# Patient Record
Sex: Female | Born: 1935
Health system: Southern US, Community
[De-identification: ages and names within clinical notes are randomized; demographics above are authoritative.]

## PROBLEM LIST (undated history)

## (undated) DIAGNOSIS — E119 Type 2 diabetes mellitus without complications: Secondary | ICD-10-CM

## (undated) DIAGNOSIS — I10 Essential (primary) hypertension: Secondary | ICD-10-CM

## (undated) HISTORY — PX: APPENDECTOMY: SHX54

---

## 2000-03-30 ENCOUNTER — Encounter: Admission: RE | Admit: 2000-03-30 | Discharge: 2000-03-30 | Payer: Self-pay | Admitting: Family Medicine

## 2000-03-30 ENCOUNTER — Encounter: Payer: Self-pay | Admitting: Family Medicine

## 2001-04-19 ENCOUNTER — Encounter: Payer: Self-pay | Admitting: Family Medicine

## 2001-04-19 ENCOUNTER — Encounter: Admission: RE | Admit: 2001-04-19 | Discharge: 2001-04-19 | Payer: Self-pay | Admitting: Family Medicine

## 2002-05-11 ENCOUNTER — Encounter: Payer: Self-pay | Admitting: Family Medicine

## 2002-05-11 ENCOUNTER — Encounter: Admission: RE | Admit: 2002-05-11 | Discharge: 2002-05-11 | Payer: Self-pay | Admitting: Family Medicine

## 2002-10-31 ENCOUNTER — Other Ambulatory Visit: Admission: RE | Admit: 2002-10-31 | Discharge: 2002-10-31 | Payer: Self-pay | Admitting: Family Medicine

## 2002-11-15 ENCOUNTER — Encounter: Admission: RE | Admit: 2002-11-15 | Discharge: 2002-11-15 | Payer: Self-pay | Admitting: Family Medicine

## 2002-11-15 ENCOUNTER — Encounter: Payer: Self-pay | Admitting: Family Medicine

## 2003-02-09 ENCOUNTER — Encounter (INDEPENDENT_AMBULATORY_CARE_PROVIDER_SITE_OTHER): Payer: Self-pay | Admitting: *Deleted

## 2003-02-09 ENCOUNTER — Ambulatory Visit (HOSPITAL_COMMUNITY): Admission: RE | Admit: 2003-02-09 | Discharge: 2003-02-09 | Payer: Self-pay

## 2003-06-19 ENCOUNTER — Encounter: Payer: Self-pay | Admitting: Family Medicine

## 2003-06-19 ENCOUNTER — Encounter: Admission: RE | Admit: 2003-06-19 | Discharge: 2003-06-19 | Payer: Self-pay | Admitting: Family Medicine

## 2003-08-14 ENCOUNTER — Encounter: Payer: Self-pay | Admitting: Family Medicine

## 2003-08-14 ENCOUNTER — Encounter: Admission: RE | Admit: 2003-08-14 | Discharge: 2003-08-14 | Payer: Self-pay | Admitting: Family Medicine

## 2003-11-20 ENCOUNTER — Other Ambulatory Visit: Admission: RE | Admit: 2003-11-20 | Discharge: 2003-11-20 | Payer: Self-pay | Admitting: Obstetrics and Gynecology

## 2004-07-01 ENCOUNTER — Encounter: Admission: RE | Admit: 2004-07-01 | Discharge: 2004-07-01 | Payer: Self-pay | Admitting: Family Medicine

## 2004-11-12 ENCOUNTER — Other Ambulatory Visit: Admission: RE | Admit: 2004-11-12 | Discharge: 2004-11-12 | Payer: Self-pay | Admitting: Family Medicine

## 2005-03-05 ENCOUNTER — Ambulatory Visit (HOSPITAL_COMMUNITY): Admission: RE | Admit: 2005-03-05 | Discharge: 2005-03-05 | Payer: Self-pay | Admitting: Gastroenterology

## 2005-07-09 ENCOUNTER — Encounter: Admission: RE | Admit: 2005-07-09 | Discharge: 2005-07-09 | Payer: Self-pay | Admitting: Family Medicine

## 2006-08-02 ENCOUNTER — Encounter: Admission: RE | Admit: 2006-08-02 | Discharge: 2006-08-02 | Payer: Self-pay | Admitting: Family Medicine

## 2007-08-01 ENCOUNTER — Other Ambulatory Visit: Admission: RE | Admit: 2007-08-01 | Discharge: 2007-08-01 | Payer: Self-pay | Admitting: Family Medicine

## 2007-08-04 ENCOUNTER — Encounter: Admission: RE | Admit: 2007-08-04 | Discharge: 2007-08-04 | Payer: Self-pay | Admitting: Family Medicine

## 2008-08-07 ENCOUNTER — Encounter: Admission: RE | Admit: 2008-08-07 | Discharge: 2008-08-07 | Payer: Self-pay | Admitting: Family Medicine

## 2009-08-08 ENCOUNTER — Encounter: Admission: RE | Admit: 2009-08-08 | Discharge: 2009-08-08 | Payer: Self-pay | Admitting: Family Medicine

## 2010-01-19 ENCOUNTER — Emergency Department (HOSPITAL_COMMUNITY): Admission: EM | Admit: 2010-01-19 | Discharge: 2010-01-19 | Payer: Self-pay | Admitting: Family Medicine

## 2010-09-02 ENCOUNTER — Encounter: Admission: RE | Admit: 2010-09-02 | Discharge: 2010-09-02 | Payer: Self-pay | Admitting: Family Medicine

## 2011-05-08 NOTE — Op Note (Signed)
   NAME:  Meghan Adkins, Meghan Adkins                            ACCOUNT NO.:  [id number]   MEDICAL RECORD NO.:  [id number]                   PATIENT TYPE:  AMB   LOCATION:  SDC                                  FACILITY:  [facility]   PHYSICIAN:  Ronda Fairly. Galen Daft, M.D.              DATE OF BIRTH:  1932/11/09   DATE OF PROCEDURE:  02/19/2003  DATE OF DISCHARGE:  02/09/2003                                 OPERATIVE REPORT   PREOPERATIVE DIAGNOSIS:  Uterine polyp.   POSTOPERATIVE DIAGNOSIS:  Uterine polyp.   PROCEDURE:  Hysteroscopic polypectomy with dilatation and curettage.   COMPLICATIONS:  None.   SURGEON:  Ronda Fairly. Galen Daft, M.D.   ESTIMATED BLOOD LOSS:  Minimal.   DESCRIPTION OF PROCEDURE:  The patient was identified positively.  We  obtained informed consent prior to bringing her to the operating room.  She  underwent hysteroscopy with a large amount of polyp-like material removed  from the uterus.  There were no complications from the procedure.  Curettage  was carried out at the end to complete the procedure.  She left the  operating room in stable condition.  Prior to her leaving the operating  room, all instrument, sponge, and needle counts were correct.  There were no  complications.                                               Ronda Fairly. Galen Daft, M.D.    NJT/MEDQ  D:  02/19/2003  T:  02/19/2003  Job:  [id number]

## 2011-05-08 NOTE — Op Note (Signed)
Meghan Adkins, Meghan Adkins                  ACCOUNT NO.:  [id number]   MEDICAL RECORD NO.:  [id number]          PATIENT TYPE:  AMB   LOCATION:  ENDO                         FACILITY:  [facility]   PHYSICIAN:  James L. Malon Kindle., M.D.DATE OF BIRTH:  Nov 18, 1932   DATE OF PROCEDURE:  03/05/2005  DATE OF DISCHARGE:                                 OPERATIVE REPORT   PROCEDURE PERFORMED:  Colonoscopy.   ENDOSCOPIST:  Llana Aliment. Edwards, M.D.   MEDICATIONS:  Fentanyl 75 mcg, Versed 6 mg IV.   INSTRUMENT USED:  Pediatric Olympus adjustable colonoscope.   INDICATIONS FOR PROCEDURE:  Colon cancer screening.   DESCRIPTION OF PROCEDURE:  The procedure had been explained to the patient  in the office and consent obtained.  With the patient in the left lateral  decubitus position, the pediatric adjustable colonoscope was inserted and  advanced.  The prep was quite good and we were able to advance to the cecum  without difficulty.  The ileocecal valve and appendiceal orifice were seen.  The scope was withdrawn.  There was pandiverticulosis throughout the entire  colon.  No evidence of diverticulitis, no polyps were seen in ascending or  descending __________ the sigmoid colon were all normal.  Prep was good.  The rectum was free of polyps.  The scope was withdrawn.  The patient  tolerated the procedure well.   ASSESSMENT:  1.  Normal screening colonoscopy with no evidence of polyps, V7651.  2.  Pandiverticulosis, 16109   PLAN:  Will give a diverticulosis sheet and recommend yearly Hemoccults.      JLE/MEDQ  D:  03/05/2005  T:  03/05/2005  Job:  [id number]   cc:   Gretta Arab. Valentina Lucks, M.D.  [address] [place]  [place] [address]  Fax: [phone]

## 2011-08-20 ENCOUNTER — Other Ambulatory Visit: Payer: Self-pay | Admitting: Family Medicine

## 2011-08-20 DIAGNOSIS — Z1231 Encounter for screening mammogram for malignant neoplasm of breast: Secondary | ICD-10-CM

## 2011-09-04 ENCOUNTER — Ambulatory Visit: Payer: Self-pay

## 2011-09-25 ENCOUNTER — Ambulatory Visit
Admission: RE | Admit: 2011-09-25 | Discharge: 2011-09-25 | Disposition: A | Discharge status: Home / Self Care | Payer: MEDICARE | Source: Ambulatory Visit | Attending: Family Medicine | Admitting: Family Medicine

## 2011-09-25 DIAGNOSIS — Z1231 Encounter for screening mammogram for malignant neoplasm of breast: Secondary | ICD-10-CM

## 2012-09-22 ENCOUNTER — Other Ambulatory Visit: Payer: Self-pay | Admitting: Family Medicine

## 2012-09-22 DIAGNOSIS — Z1231 Encounter for screening mammogram for malignant neoplasm of breast: Secondary | ICD-10-CM

## 2012-10-20 ENCOUNTER — Ambulatory Visit
Admission: RE | Admit: 2012-10-20 | Discharge: 2012-10-20 | Disposition: A | Discharge status: Home / Self Care | Payer: MEDICARE | Source: Ambulatory Visit | Attending: Family Medicine | Admitting: Family Medicine

## 2012-10-20 DIAGNOSIS — Z1231 Encounter for screening mammogram for malignant neoplasm of breast: Secondary | ICD-10-CM

## 2013-10-03 ENCOUNTER — Other Ambulatory Visit: Payer: Self-pay | Admitting: Family Medicine

## 2013-10-03 DIAGNOSIS — Z1231 Encounter for screening mammogram for malignant neoplasm of breast: Secondary | ICD-10-CM

## 2013-10-25 ENCOUNTER — Ambulatory Visit
Admission: RE | Admit: 2013-10-25 | Discharge: 2013-10-25 | Disposition: A | Discharge status: Home / Self Care | Payer: Medicare Other | Source: Ambulatory Visit | Attending: Family Medicine | Admitting: Family Medicine

## 2013-10-25 DIAGNOSIS — Z1231 Encounter for screening mammogram for malignant neoplasm of breast: Secondary | ICD-10-CM

## 2014-09-25 ENCOUNTER — Other Ambulatory Visit: Payer: Self-pay

## 2014-09-25 DIAGNOSIS — Z1239 Encounter for other screening for malignant neoplasm of breast: Secondary | ICD-10-CM

## 2014-10-30 ENCOUNTER — Ambulatory Visit
Admission: RE | Admit: 2014-10-30 | Discharge: 2014-10-30 | Disposition: A | Discharge status: Home / Self Care | Payer: Medicare Other | Source: Ambulatory Visit

## 2014-10-30 ENCOUNTER — Encounter (INDEPENDENT_AMBULATORY_CARE_PROVIDER_SITE_OTHER): Payer: Self-pay

## 2014-10-30 DIAGNOSIS — Z1239 Encounter for other screening for malignant neoplasm of breast: Secondary | ICD-10-CM

## 2015-09-25 ENCOUNTER — Other Ambulatory Visit: Payer: Self-pay

## 2015-09-25 DIAGNOSIS — Z1231 Encounter for screening mammogram for malignant neoplasm of breast: Secondary | ICD-10-CM

## 2015-11-04 ENCOUNTER — Ambulatory Visit
Admission: RE | Admit: 2015-11-04 | Discharge: 2015-11-04 | Disposition: A | Discharge status: Home / Self Care | Payer: Medicare Other | Source: Ambulatory Visit

## 2015-11-04 DIAGNOSIS — Z1231 Encounter for screening mammogram for malignant neoplasm of breast: Secondary | ICD-10-CM

## 2016-03-18 DIAGNOSIS — H43811 Vitreous degeneration, right eye: Secondary | ICD-10-CM | POA: Diagnosis not present

## 2016-03-18 DIAGNOSIS — H2513 Age-related nuclear cataract, bilateral: Secondary | ICD-10-CM | POA: Diagnosis not present

## 2016-03-18 DIAGNOSIS — E119 Type 2 diabetes mellitus without complications: Secondary | ICD-10-CM | POA: Diagnosis not present

## 2016-03-18 DIAGNOSIS — H401132 Primary open-angle glaucoma, bilateral, moderate stage: Secondary | ICD-10-CM | POA: Diagnosis not present

## 2016-06-16 DIAGNOSIS — E119 Type 2 diabetes mellitus without complications: Secondary | ICD-10-CM | POA: Diagnosis not present

## 2016-06-16 DIAGNOSIS — I10 Essential (primary) hypertension: Secondary | ICD-10-CM | POA: Diagnosis not present

## 2016-06-16 DIAGNOSIS — M7502 Adhesive capsulitis of left shoulder: Secondary | ICD-10-CM | POA: Diagnosis not present

## 2016-06-16 DIAGNOSIS — E7800 Pure hypercholesterolemia, unspecified: Secondary | ICD-10-CM | POA: Diagnosis not present

## 2016-06-22 DIAGNOSIS — M25512 Pain in left shoulder: Secondary | ICD-10-CM | POA: Diagnosis not present

## 2016-06-22 DIAGNOSIS — M7502 Adhesive capsulitis of left shoulder: Secondary | ICD-10-CM | POA: Diagnosis not present

## 2016-07-02 DIAGNOSIS — M25512 Pain in left shoulder: Secondary | ICD-10-CM | POA: Diagnosis not present

## 2016-07-02 DIAGNOSIS — M7502 Adhesive capsulitis of left shoulder: Secondary | ICD-10-CM | POA: Diagnosis not present

## 2016-09-30 ENCOUNTER — Other Ambulatory Visit: Payer: Self-pay | Admitting: Family Medicine

## 2016-09-30 DIAGNOSIS — Z1231 Encounter for screening mammogram for malignant neoplasm of breast: Secondary | ICD-10-CM

## 2016-11-09 ENCOUNTER — Ambulatory Visit
Admission: RE | Admit: 2016-11-09 | Discharge: 2016-11-09 | Disposition: A | Discharge status: Home / Self Care | Payer: Medicare Other | Source: Ambulatory Visit | Attending: Family Medicine | Admitting: Family Medicine

## 2016-11-09 DIAGNOSIS — H401132 Primary open-angle glaucoma, bilateral, moderate stage: Secondary | ICD-10-CM | POA: Diagnosis not present

## 2016-11-09 DIAGNOSIS — Z1231 Encounter for screening mammogram for malignant neoplasm of breast: Secondary | ICD-10-CM

## 2016-11-09 DIAGNOSIS — H2513 Age-related nuclear cataract, bilateral: Secondary | ICD-10-CM | POA: Diagnosis not present

## 2016-11-09 DIAGNOSIS — H43811 Vitreous degeneration, right eye: Secondary | ICD-10-CM | POA: Diagnosis not present

## 2016-11-09 DIAGNOSIS — E119 Type 2 diabetes mellitus without complications: Secondary | ICD-10-CM | POA: Diagnosis not present

## 2016-11-24 DIAGNOSIS — Z23 Encounter for immunization: Secondary | ICD-10-CM | POA: Diagnosis not present

## 2016-11-24 DIAGNOSIS — E119 Type 2 diabetes mellitus without complications: Secondary | ICD-10-CM | POA: Diagnosis not present

## 2016-11-24 DIAGNOSIS — I10 Essential (primary) hypertension: Secondary | ICD-10-CM | POA: Diagnosis not present

## 2016-11-24 DIAGNOSIS — E7800 Pure hypercholesterolemia, unspecified: Secondary | ICD-10-CM | POA: Diagnosis not present

## 2016-11-24 DIAGNOSIS — Z0000 Encounter for general adult medical examination without abnormal findings: Secondary | ICD-10-CM | POA: Diagnosis not present

## 2017-03-11 DIAGNOSIS — H2513 Age-related nuclear cataract, bilateral: Secondary | ICD-10-CM | POA: Diagnosis not present

## 2017-03-11 DIAGNOSIS — E119 Type 2 diabetes mellitus without complications: Secondary | ICD-10-CM | POA: Diagnosis not present

## 2017-03-11 DIAGNOSIS — H401132 Primary open-angle glaucoma, bilateral, moderate stage: Secondary | ICD-10-CM | POA: Diagnosis not present

## 2017-03-11 DIAGNOSIS — H43811 Vitreous degeneration, right eye: Secondary | ICD-10-CM | POA: Diagnosis not present

## 2017-06-14 DIAGNOSIS — I10 Essential (primary) hypertension: Secondary | ICD-10-CM | POA: Diagnosis not present

## 2017-06-14 DIAGNOSIS — E1165 Type 2 diabetes mellitus with hyperglycemia: Secondary | ICD-10-CM | POA: Diagnosis not present

## 2017-06-14 DIAGNOSIS — Z7984 Long term (current) use of oral hypoglycemic drugs: Secondary | ICD-10-CM | POA: Diagnosis not present

## 2017-06-14 DIAGNOSIS — E7800 Pure hypercholesterolemia, unspecified: Secondary | ICD-10-CM | POA: Diagnosis not present

## 2017-08-17 DIAGNOSIS — H401132 Primary open-angle glaucoma, bilateral, moderate stage: Secondary | ICD-10-CM | POA: Diagnosis not present

## 2017-08-17 DIAGNOSIS — H2513 Age-related nuclear cataract, bilateral: Secondary | ICD-10-CM | POA: Diagnosis not present

## 2017-09-13 DIAGNOSIS — Z23 Encounter for immunization: Secondary | ICD-10-CM | POA: Diagnosis not present

## 2017-10-07 ENCOUNTER — Other Ambulatory Visit: Payer: Self-pay | Admitting: Family Medicine

## 2017-10-07 DIAGNOSIS — Z1231 Encounter for screening mammogram for malignant neoplasm of breast: Secondary | ICD-10-CM

## 2017-11-10 ENCOUNTER — Ambulatory Visit
Admission: RE | Admit: 2017-11-10 | Discharge: 2017-11-10 | Disposition: A | Discharge status: Home / Self Care | Payer: Medicare Other | Source: Ambulatory Visit | Attending: Family Medicine | Admitting: Family Medicine

## 2017-11-10 DIAGNOSIS — Z1231 Encounter for screening mammogram for malignant neoplasm of breast: Secondary | ICD-10-CM | POA: Diagnosis not present

## 2017-12-10 DIAGNOSIS — E7800 Pure hypercholesterolemia, unspecified: Secondary | ICD-10-CM | POA: Diagnosis not present

## 2017-12-10 DIAGNOSIS — Z0000 Encounter for general adult medical examination without abnormal findings: Secondary | ICD-10-CM | POA: Diagnosis not present

## 2017-12-10 DIAGNOSIS — E119 Type 2 diabetes mellitus without complications: Secondary | ICD-10-CM | POA: Diagnosis not present

## 2017-12-10 DIAGNOSIS — I10 Essential (primary) hypertension: Secondary | ICD-10-CM | POA: Diagnosis not present

## 2017-12-10 DIAGNOSIS — Z1389 Encounter for screening for other disorder: Secondary | ICD-10-CM | POA: Diagnosis not present

## 2018-02-15 DIAGNOSIS — H2513 Age-related nuclear cataract, bilateral: Secondary | ICD-10-CM | POA: Diagnosis not present

## 2018-02-15 DIAGNOSIS — E119 Type 2 diabetes mellitus without complications: Secondary | ICD-10-CM | POA: Diagnosis not present

## 2018-02-15 DIAGNOSIS — H401132 Primary open-angle glaucoma, bilateral, moderate stage: Secondary | ICD-10-CM | POA: Diagnosis not present

## 2018-06-06 ENCOUNTER — Ambulatory Visit
Admission: RE | Admit: 2018-06-06 | Discharge: 2018-06-06 | Disposition: A | Discharge status: Home / Self Care | Payer: Medicare HMO | Source: Ambulatory Visit | Attending: Family Medicine | Admitting: Family Medicine

## 2018-06-06 ENCOUNTER — Other Ambulatory Visit: Payer: Self-pay | Admitting: Family Medicine

## 2018-06-06 DIAGNOSIS — M79641 Pain in right hand: Secondary | ICD-10-CM

## 2018-06-06 DIAGNOSIS — M7989 Other specified soft tissue disorders: Secondary | ICD-10-CM | POA: Diagnosis not present

## 2018-06-06 DIAGNOSIS — E119 Type 2 diabetes mellitus without complications: Secondary | ICD-10-CM | POA: Diagnosis not present

## 2018-06-13 DIAGNOSIS — E119 Type 2 diabetes mellitus without complications: Secondary | ICD-10-CM | POA: Diagnosis not present

## 2018-06-13 DIAGNOSIS — I10 Essential (primary) hypertension: Secondary | ICD-10-CM | POA: Diagnosis not present

## 2018-06-13 DIAGNOSIS — Z7984 Long term (current) use of oral hypoglycemic drugs: Secondary | ICD-10-CM | POA: Diagnosis not present

## 2018-06-13 DIAGNOSIS — E7800 Pure hypercholesterolemia, unspecified: Secondary | ICD-10-CM | POA: Diagnosis not present

## 2018-06-20 DIAGNOSIS — M25539 Pain in unspecified wrist: Secondary | ICD-10-CM | POA: Diagnosis not present

## 2018-06-29 DIAGNOSIS — M25531 Pain in right wrist: Secondary | ICD-10-CM | POA: Diagnosis not present

## 2018-07-18 DIAGNOSIS — Z8249 Family history of ischemic heart disease and other diseases of the circulatory system: Secondary | ICD-10-CM | POA: Diagnosis not present

## 2018-07-18 DIAGNOSIS — K08409 Partial loss of teeth, unspecified cause, unspecified class: Secondary | ICD-10-CM | POA: Diagnosis not present

## 2018-07-18 DIAGNOSIS — I10 Essential (primary) hypertension: Secondary | ICD-10-CM | POA: Diagnosis not present

## 2018-07-18 DIAGNOSIS — E119 Type 2 diabetes mellitus without complications: Secondary | ICD-10-CM | POA: Diagnosis not present

## 2018-07-18 DIAGNOSIS — Z7984 Long term (current) use of oral hypoglycemic drugs: Secondary | ICD-10-CM | POA: Diagnosis not present

## 2018-07-18 DIAGNOSIS — Z809 Family history of malignant neoplasm, unspecified: Secondary | ICD-10-CM | POA: Diagnosis not present

## 2018-07-18 DIAGNOSIS — E785 Hyperlipidemia, unspecified: Secondary | ICD-10-CM | POA: Diagnosis not present

## 2018-07-18 DIAGNOSIS — Z87891 Personal history of nicotine dependence: Secondary | ICD-10-CM | POA: Diagnosis not present

## 2018-09-07 DIAGNOSIS — H2513 Age-related nuclear cataract, bilateral: Secondary | ICD-10-CM | POA: Diagnosis not present

## 2018-09-07 DIAGNOSIS — E119 Type 2 diabetes mellitus without complications: Secondary | ICD-10-CM | POA: Diagnosis not present

## 2018-09-07 DIAGNOSIS — H401132 Primary open-angle glaucoma, bilateral, moderate stage: Secondary | ICD-10-CM | POA: Diagnosis not present

## 2018-10-11 ENCOUNTER — Other Ambulatory Visit: Payer: Self-pay | Admitting: Family Medicine

## 2018-10-11 DIAGNOSIS — Z1231 Encounter for screening mammogram for malignant neoplasm of breast: Secondary | ICD-10-CM

## 2018-11-03 DIAGNOSIS — R69 Illness, unspecified: Secondary | ICD-10-CM | POA: Diagnosis not present

## 2018-11-23 ENCOUNTER — Ambulatory Visit
Admission: RE | Admit: 2018-11-23 | Discharge: 2018-11-23 | Disposition: A | Discharge status: Home / Self Care | Payer: Medicare HMO | Source: Ambulatory Visit | Attending: Family Medicine | Admitting: Family Medicine

## 2018-11-23 DIAGNOSIS — Z1231 Encounter for screening mammogram for malignant neoplasm of breast: Secondary | ICD-10-CM

## 2019-01-19 DIAGNOSIS — H9190 Unspecified hearing loss, unspecified ear: Secondary | ICD-10-CM | POA: Diagnosis not present

## 2019-01-19 DIAGNOSIS — E1169 Type 2 diabetes mellitus with other specified complication: Secondary | ICD-10-CM | POA: Diagnosis not present

## 2019-01-19 DIAGNOSIS — E7800 Pure hypercholesterolemia, unspecified: Secondary | ICD-10-CM | POA: Diagnosis not present

## 2019-01-19 DIAGNOSIS — Z0000 Encounter for general adult medical examination without abnormal findings: Secondary | ICD-10-CM | POA: Diagnosis not present

## 2019-01-19 DIAGNOSIS — Z1389 Encounter for screening for other disorder: Secondary | ICD-10-CM | POA: Diagnosis not present

## 2019-01-19 DIAGNOSIS — I10 Essential (primary) hypertension: Secondary | ICD-10-CM | POA: Diagnosis not present

## 2019-01-19 DIAGNOSIS — Z7984 Long term (current) use of oral hypoglycemic drugs: Secondary | ICD-10-CM | POA: Diagnosis not present

## 2019-02-14 DIAGNOSIS — H903 Sensorineural hearing loss, bilateral: Secondary | ICD-10-CM | POA: Diagnosis not present

## 2019-12-31 IMAGING — CR DG HAND COMPLETE 3+V*R*
3 series · 3 of 3 positions shown · non-contrast
Comparison: None

CLINICAL DATA: RIGHT hand pain for 1 month at the level of the
distal radius and ulna

EXAM:
RIGHT HAND - COMPLETE 3+ VIEW

[x hand pa right]
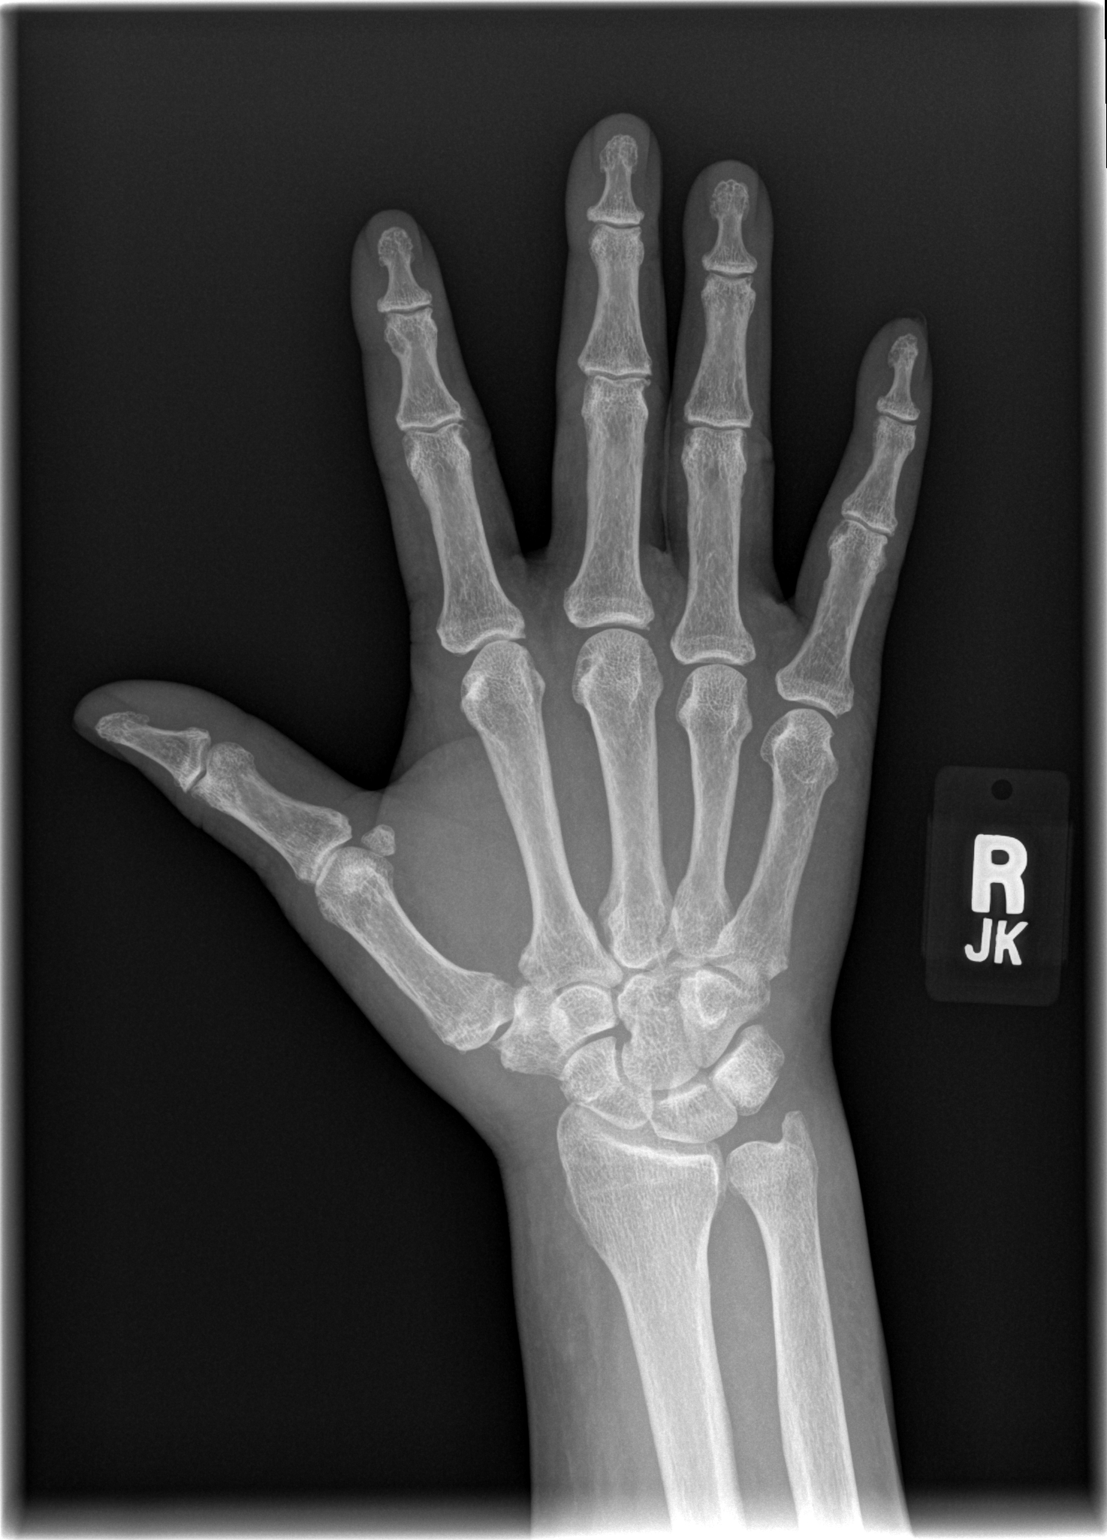

[x hand oblique right]
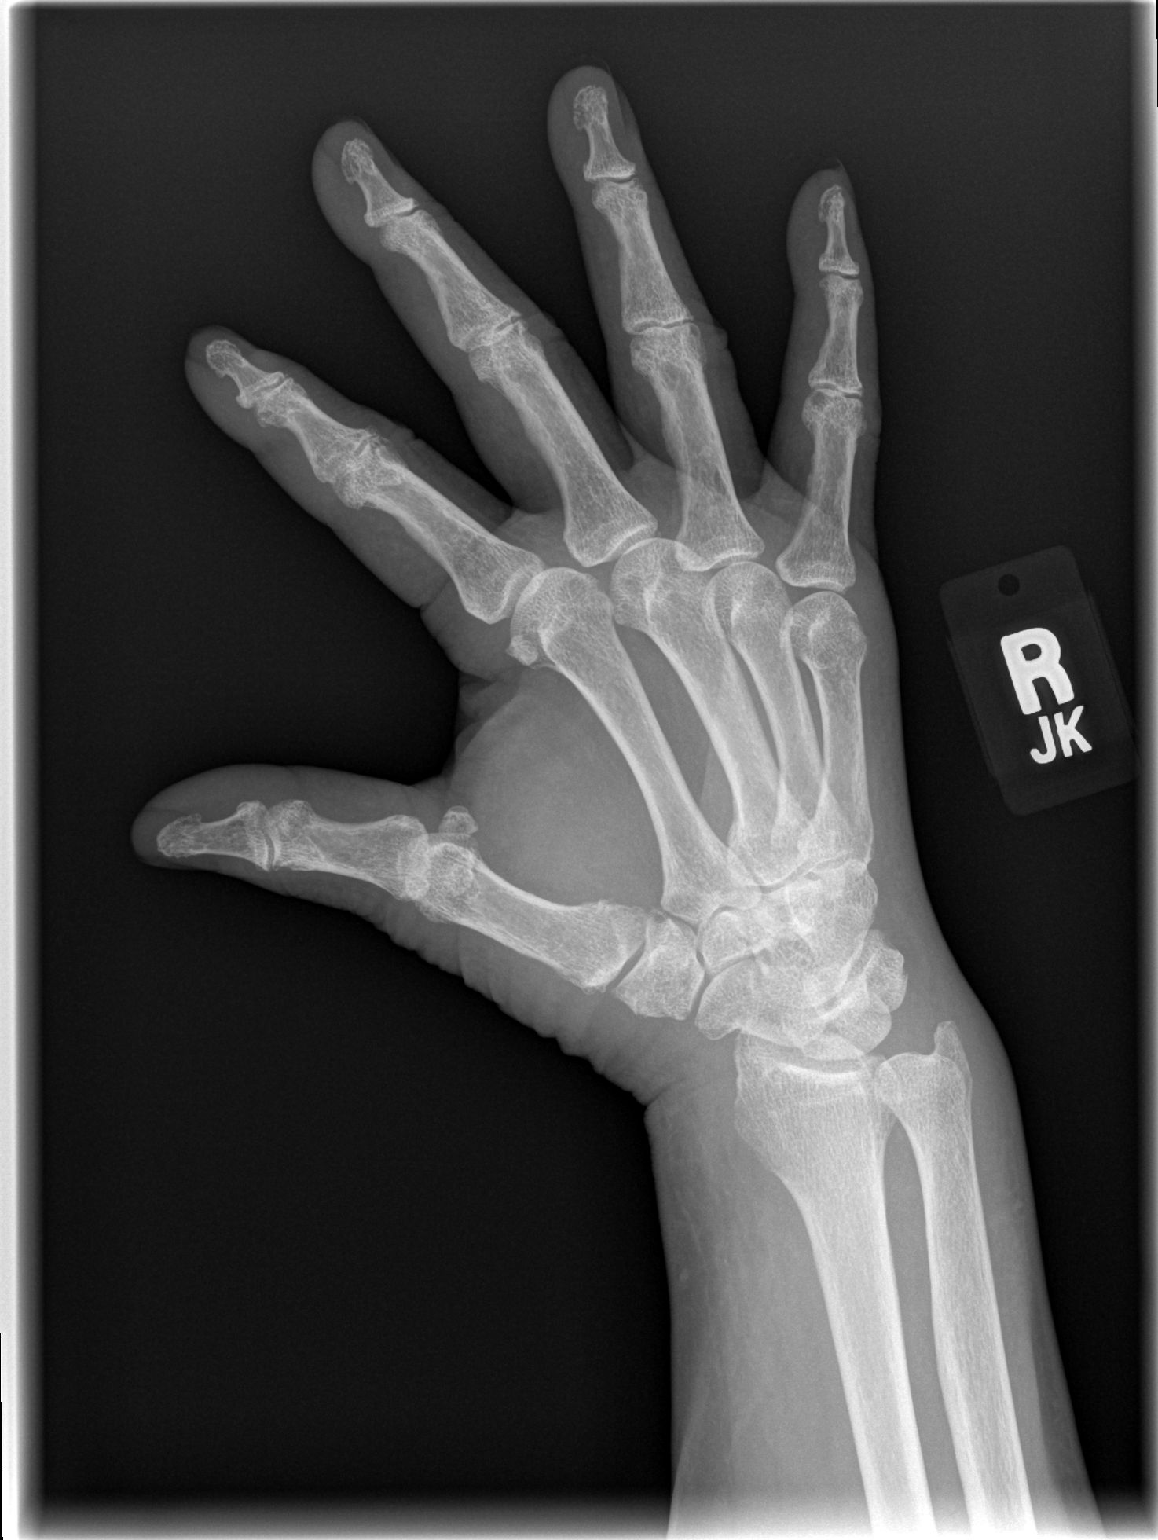

[x hand lat right]
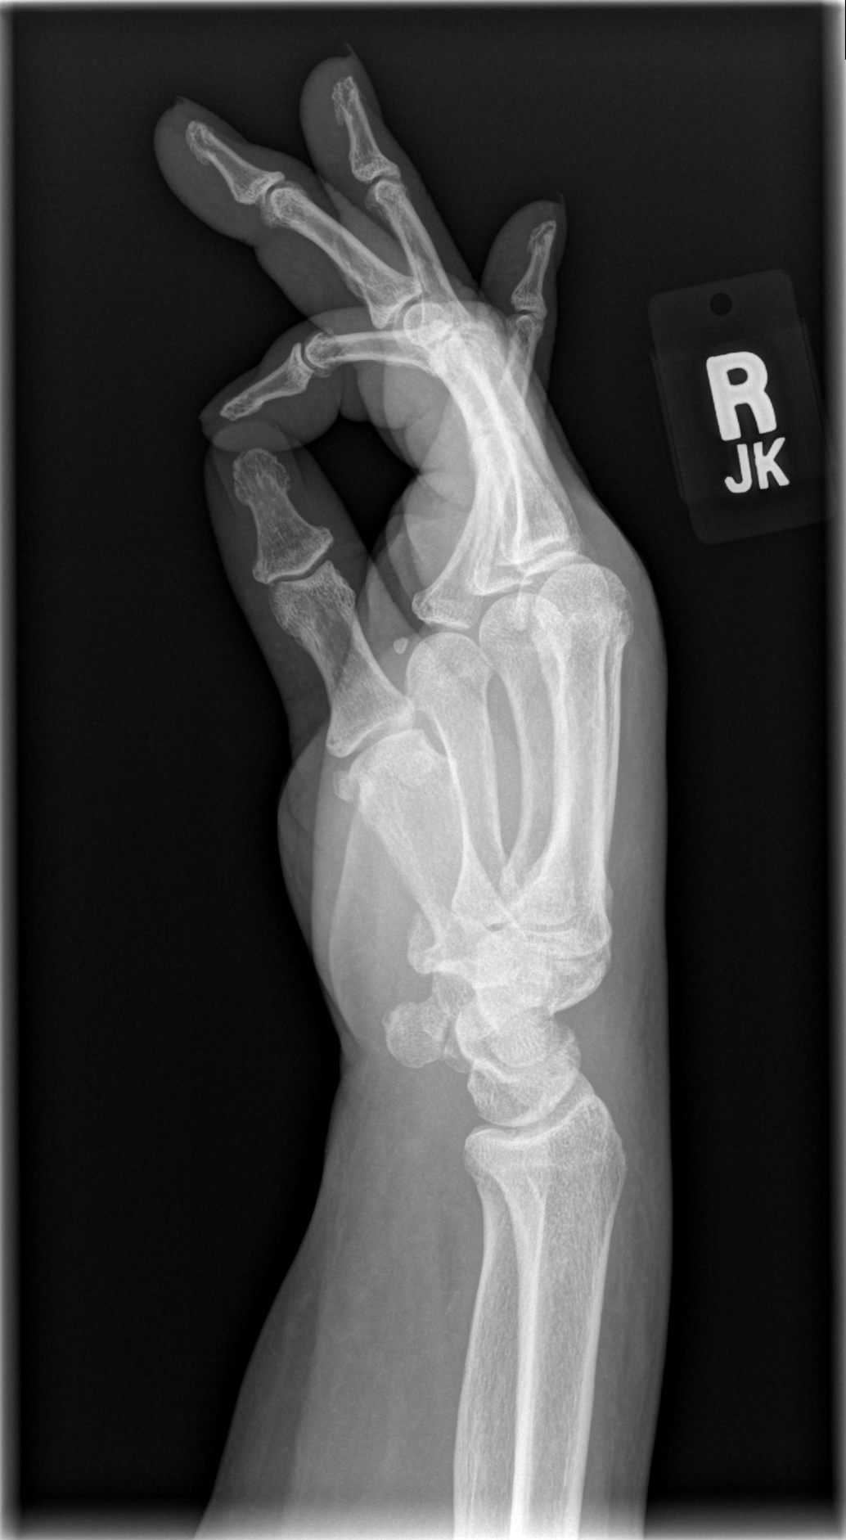

[3 of 3 positions shown; findings below may reference images not displayed]

FINDINGS: Osseous mineralization appears decreased.

Joint spaces fairly well preserved for age.

No acute fracture, dislocation, or bone destruction.

Mild soft tissue swelling at RIGHT wrist through RIGHT hand.

No erosive changes identified.
IMPRESSION: Soft tissue swelling without acute bony abnormalities.

## 2020-05-20 ENCOUNTER — Encounter (HOSPITAL_COMMUNITY): Payer: Self-pay

## 2020-05-20 ENCOUNTER — Other Ambulatory Visit: Payer: Self-pay

## 2020-05-20 ENCOUNTER — Ambulatory Visit (HOSPITAL_COMMUNITY)
Admission: EM | Admit: 2020-05-20 | Discharge: 2020-05-20 | Disposition: A | Discharge status: Home / Self Care | Payer: Medicare HMO | Attending: Physician Assistant | Admitting: Physician Assistant

## 2020-05-20 DIAGNOSIS — I10 Essential (primary) hypertension: Secondary | ICD-10-CM

## 2020-05-20 DIAGNOSIS — M542 Cervicalgia: Secondary | ICD-10-CM | POA: Diagnosis not present

## 2020-05-20 HISTORY — DX: Essential (primary) hypertension: I10

## 2020-05-20 HISTORY — DX: Type 2 diabetes mellitus without complications: E11.9

## 2020-05-20 MED ORDER — DICLOFENAC SODIUM 1 % EX GEL: 2.0000 g | Freq: Four times a day (QID) | CUTANEOUS | 0 refills | Status: AC

## 2020-05-20 MED ORDER — ACETAMINOPHEN 325 MG PO TABS: 650.0000 mg | ORAL_TABLET | Freq: Four times a day (QID) | ORAL | 0 refills | Status: AC | PRN

## 2020-05-20 NOTE — Discharge Instructions (Signed)
Take 2 regular strength Tylenol every 8 hours Apply the Voltaren gel area pain twice a day  Apply warm heat packs or ice.  reStart your blood pressure medicines.  Contact your primary care provider tomorrow for follow-up on blood pressure and neck pain  You have chest pain, shortness of breath, numbness severe headache or weakness please report to the emergency department.

## 2020-05-20 NOTE — ED Triage Notes (Signed)
Pt c/o 10/10 tight pain in left side of neck started yesterday. Pt unable to move head side to side without pain. Pt denies injury. Pt denies numbness and tingling. Pt able to move all extremities.

## 2020-05-20 NOTE — ED Provider Notes (Signed)
[facility]    CSN: [id number] Arrival date & time: 05/20/20  1650      History   Chief Complaint Chief Complaint  Patient presents with  . Neck Pain    HPI Meghan Adkins is a 86 y.o. female.   Patient reports for 2 day history of left-sided upper neck pain.  She reports pain is just her her neck and head meet.  She reports that hurts with certain movements.  She reports if she is sitting still it does not hurt very much.  She reports this is down the back of her neck occasionally.  She reports a similar issue about a year ago that went away with some gentle massage however this 1 has not gone away and this is why she is reporting today.  She denies any injury to her neck or head recently or historically she denies any numbness, tingling or weakness in her upper extremities.  Denies headache, blurry vision, chest pain or shortness of breath, lower extremity edema.  Denies dizziness.  She reports she is on blood pressure medicines and has a primary care physician however she has not taken her blood pressure medicines for at least a week.  She was unaware that her blood pressure is elevated as it is today.     Past Medical History:  Diagnosis Date  . Diabetes mellitus without complication (HCC)   . Hypertension     There are no problems to display for this patient.   Past Surgical History:  Procedure Laterality Date  . APPENDECTOMY    . CESAREAN SECTION      OB History   No obstetric history on file.      Home Medications    Prior to Admission medications   Medication Sig Start Date End Date Taking? Authorizing Provider  acetaminophen (TYLENOL) 325 MG tablet Take 2 tablets (650 mg total) by mouth every 6 (six) hours as needed. 05/20/20   Claressa Hughley, Veryl Speak, PA-C  diclofenac Sodium (VOLTAREN) 1 % GEL Apply 2 g topically 4 (four) times daily. 05/20/20   Tyneisha Hegeman, Veryl Speak, PA-C    Family History No family history on file.  Social History Social History   Tobacco  Use  . Smoking status: Former Games developer  . Smokeless tobacco: Never Used  Substance Use Topics  . Alcohol use: Never  . Drug use: Never     Allergies   Patient has no known allergies.   Review of Systems Review of Systems   Physical Exam Triage Vital Signs ED Triage Vitals  Enc Vitals Group     BP 05/20/20 1734 (!) 193/78     Pulse Rate 05/20/20 1734 79     Resp 05/20/20 1734 18     Temp 05/20/20 1734 98.6 F (37 C)     Temp Source 05/20/20 1734 Oral     SpO2 05/20/20 1734 100 %     Weight 05/20/20 1738 145 lb (65.8 kg)     Height 05/20/20 1738 5\' 3"  (1.6 m)     Head Circumference --      Peak Flow --      Pain Score 05/20/20 1738 10     Pain Loc --      Pain Edu? --      Excl. in GC? --    No data found.  Updated Vital Signs BP (!) 193/78   Pulse 79   Temp 98.6 F (37 C) (Oral)   Resp 18   Ht  5\' 3"  (1.6 m)   Wt 145 lb (65.8 kg)   SpO2 100%   BMI 25.69 kg/m   Visual Acuity Right Eye Distance:   Left Eye Distance:   Bilateral Distance:    Right Eye Near:   Left Eye Near:    Bilateral Near:     Physical Exam Vitals and nursing note reviewed.  Constitutional:      General: She is not in acute distress.    Appearance: Normal appearance. She is well-developed. She is not ill-appearing.  HENT:     Head: Normocephalic and atraumatic.  Eyes:     Conjunctiva/sclera: Conjunctivae normal.  Neck:     Vascular: No carotid bruit.     Comments: Tenderness to palpation over the insertion of the left sternocleidomastoid.  Patient has full range of motion of the neck however there is pain elicited with looking to her right, forward flexion and extension. Cardiovascular:     Rate and Rhythm: Normal rate and regular rhythm.     Heart sounds: No murmur.  Pulmonary:     Effort: Pulmonary effort is normal. No respiratory distress.     Breath sounds: Normal breath sounds. No wheezing, rhonchi or rales.  Abdominal:     Palpations: Abdomen is soft.     Tenderness:  There is no abdominal tenderness.  Musculoskeletal:     Cervical back: Neck supple. No rigidity.     Right lower leg: No edema.     Left lower leg: No edema.  Skin:    General: Skin is warm and dry.  Neurological:     General: No focal deficit present.     Mental Status: She is alert and oriented to person, place, and time.     Cranial Nerves: No cranial nerve deficit.     Sensory: No sensory deficit.     Motor: No weakness.     Coordination: Coordination normal.      UC Treatments / Results  Labs (all labs ordered are listed, but only abnormal results are displayed) Labs Reviewed - No data to display  EKG   Radiology No results found.  Procedures Procedures (including critical care time)  Medications Ordered in UC Medications - No data to display  Initial Impression / Assessment and Plan / UC Course  I have reviewed the triage vital signs and the nursing notes.  Pertinent labs & imaging results that were available during my care of the patient were reviewed by me and considered in my medical decision making (see chart for details).     #Neck pain #Elevated blood pressure Patient is an 86 year old with history of hypertension presenting with left-sided neck pain.  Pain is over the musculature the sternocleidomastoid.  Given reproducibility doubt this is secondary to hypertension.  She is neurologically well and asymptomatic from standpoint of her hypertension today.  She reports she has blood pressure medicine at home and will restart this tonight.  Instructed patient to call her primary care tomorrow to discuss close follow-up.  Strict emergency department precautions were discussed.  We will start her on Tylenol and topical Voltaren to aid in her pain relief.  Patient verbalized understanding his plan Final Clinical Impressions(s) / UC Diagnoses   Final diagnoses:  Neck pain  Elevated blood pressure reading in office with diagnosis of hypertension     Discharge  Instructions     Take 2 regular strength Tylenol every 8 hours Apply the Voltaren gel area pain twice a day  Apply warm heat  packs or ice.  reStart your blood pressure medicines.  Contact your primary care provider tomorrow for follow-up on blood pressure and neck pain  You have chest pain, shortness of breath, numbness severe headache or weakness please report to the emergency department.      ED Prescriptions    Medication Sig Dispense Auth. Provider   acetaminophen (TYLENOL) 325 MG tablet Take 2 tablets (650 mg total) by mouth every 6 (six) hours as needed. 30 tablet Sarena Jezek, Veryl Speak, PA-C   diclofenac Sodium (VOLTAREN) 1 % GEL Apply 2 g topically 4 (four) times daily. 50 g Stacye Noori, Veryl Speak, PA-C     PDMP not reviewed this encounter.   Hermelinda Medicus, PA-C 05/20/20 [phone]

## 2020-06-18 IMAGING — MG DIGITAL SCREENING BILATERAL MAMMOGRAM WITH CAD
4 series · 4 of 4 positions shown · non-contrast
Comparison: Previous exam(s).

CLINICAL DATA: Screening.

EXAM:
DIGITAL SCREENING BILATERAL MAMMOGRAM WITH CAD

[R CC]
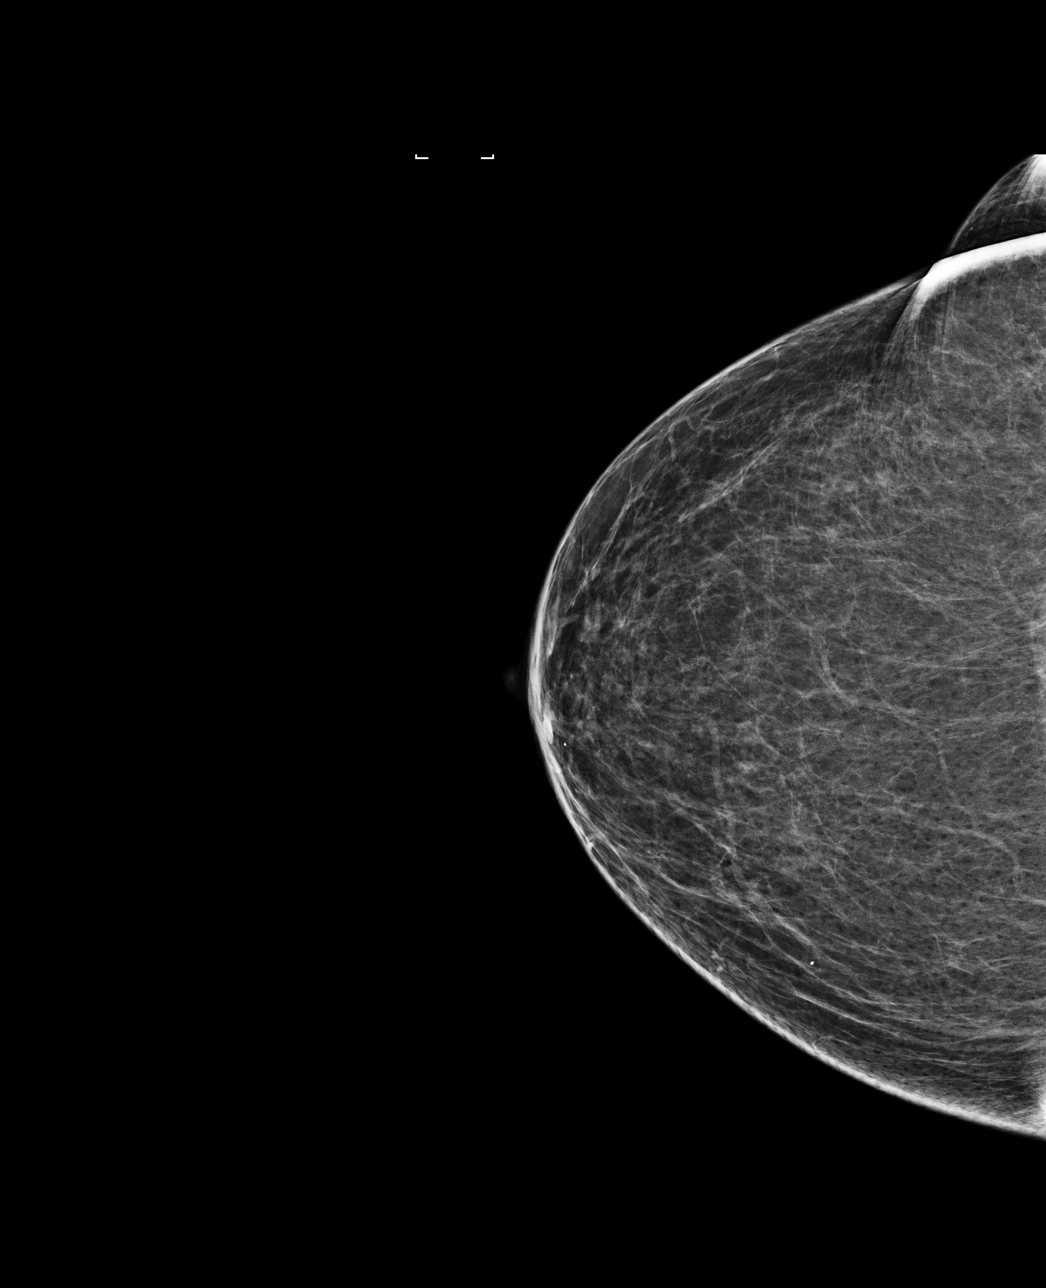

[L MLO]
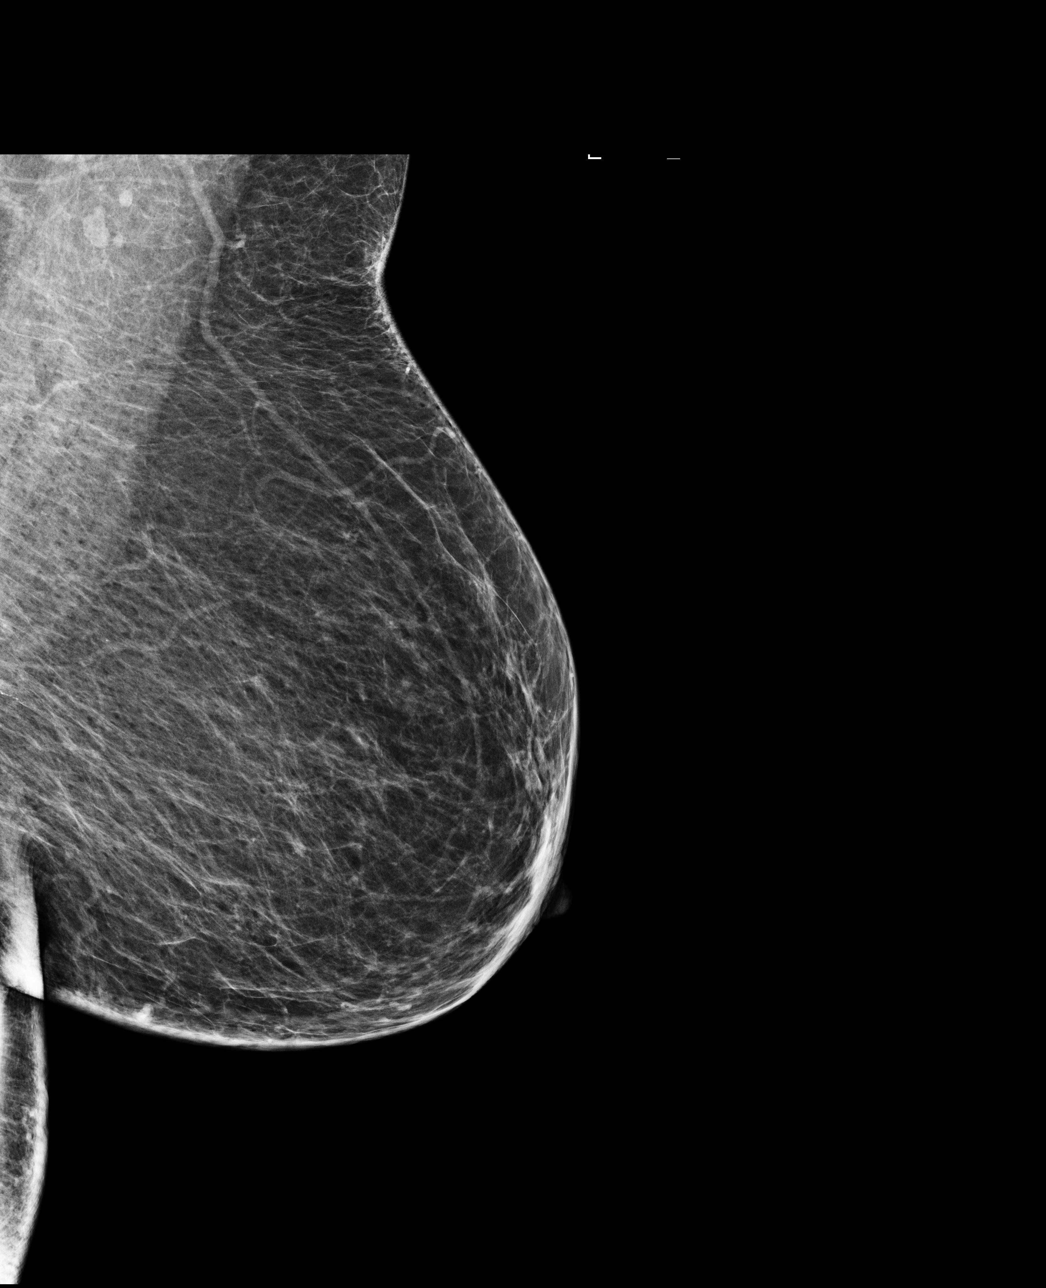

[L CC]
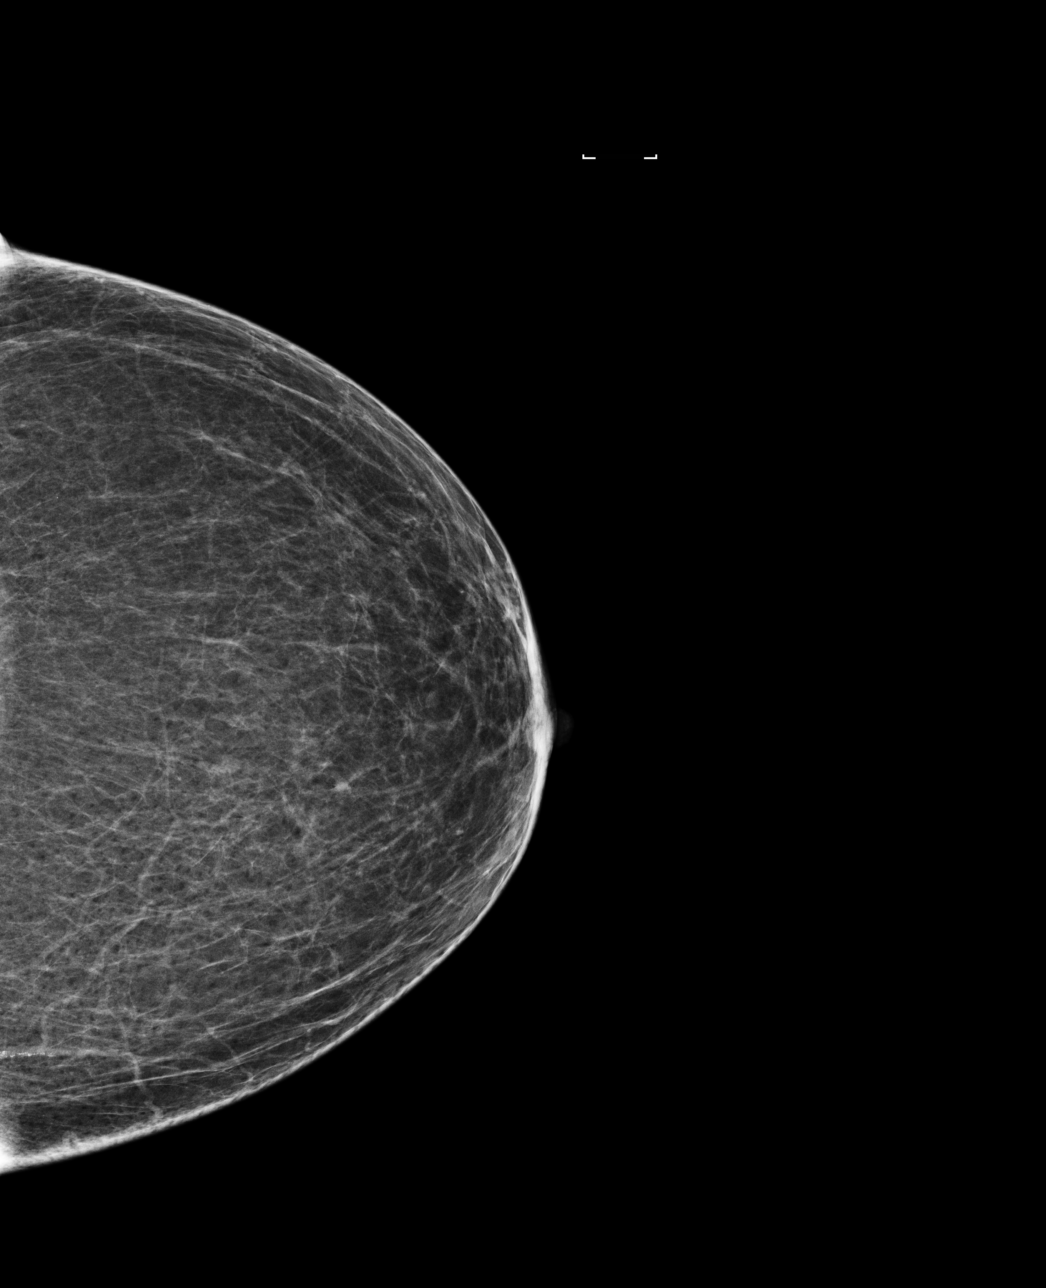

[R MLO]
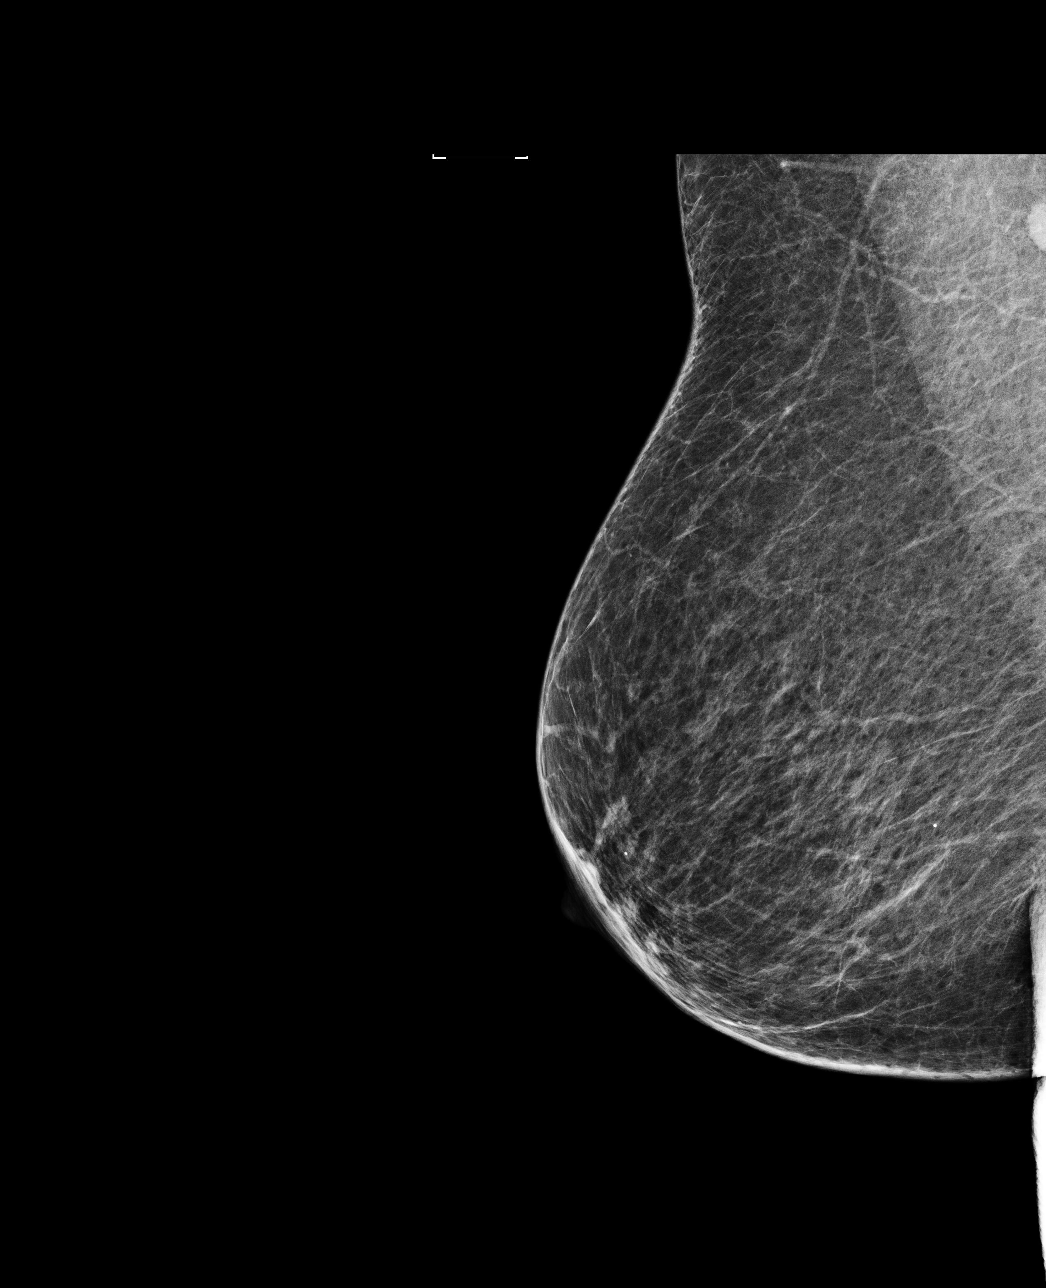

[4 of 4 positions shown; findings below may reference images not displayed]

ACR Breast Density Category b: There are scattered areas of
fibroglandular density.
FINDINGS: There are no findings suspicious for malignancy. Images were
processed with CAD.
IMPRESSION: No mammographic evidence of malignancy. A result letter of this
screening mammogram will be mailed directly to the patient.

RECOMMENDATION:
Screening mammogram in one year. (Code:AS-G-LCT)

BI-RADS CATEGORY  1: Negative.

## 2021-03-26 DIAGNOSIS — M542 Cervicalgia: Secondary | ICD-10-CM | POA: Diagnosis not present

## 2021-04-16 DIAGNOSIS — Z0000 Encounter for general adult medical examination without abnormal findings: Secondary | ICD-10-CM | POA: Diagnosis not present

## 2021-04-16 DIAGNOSIS — E1169 Type 2 diabetes mellitus with other specified complication: Secondary | ICD-10-CM | POA: Diagnosis not present

## 2021-04-16 DIAGNOSIS — I129 Hypertensive chronic kidney disease with stage 1 through stage 4 chronic kidney disease, or unspecified chronic kidney disease: Secondary | ICD-10-CM | POA: Diagnosis not present

## 2021-04-16 DIAGNOSIS — N182 Chronic kidney disease, stage 2 (mild): Secondary | ICD-10-CM | POA: Diagnosis not present

## 2021-09-15 DIAGNOSIS — E119 Type 2 diabetes mellitus without complications: Secondary | ICD-10-CM | POA: Diagnosis not present

## 2021-09-15 DIAGNOSIS — E1169 Type 2 diabetes mellitus with other specified complication: Secondary | ICD-10-CM | POA: Diagnosis not present

## 2021-09-15 DIAGNOSIS — I129 Hypertensive chronic kidney disease with stage 1 through stage 4 chronic kidney disease, or unspecified chronic kidney disease: Secondary | ICD-10-CM | POA: Diagnosis not present

## 2021-11-21 DIAGNOSIS — I129 Hypertensive chronic kidney disease with stage 1 through stage 4 chronic kidney disease, or unspecified chronic kidney disease: Secondary | ICD-10-CM | POA: Diagnosis not present

## 2021-11-21 DIAGNOSIS — H40033 Anatomical narrow angle, bilateral: Secondary | ICD-10-CM | POA: Diagnosis not present

## 2021-11-21 DIAGNOSIS — H2513 Age-related nuclear cataract, bilateral: Secondary | ICD-10-CM | POA: Diagnosis not present

## 2021-11-21 DIAGNOSIS — E7800 Pure hypercholesterolemia, unspecified: Secondary | ICD-10-CM | POA: Diagnosis not present

## 2021-11-21 DIAGNOSIS — E1169 Type 2 diabetes mellitus with other specified complication: Secondary | ICD-10-CM | POA: Diagnosis not present

## 2021-11-21 DIAGNOSIS — Z7984 Long term (current) use of oral hypoglycemic drugs: Secondary | ICD-10-CM | POA: Diagnosis not present

## 2022-02-16 DIAGNOSIS — H25813 Combined forms of age-related cataract, bilateral: Secondary | ICD-10-CM | POA: Diagnosis not present

## 2022-02-16 DIAGNOSIS — Q078 Other specified congenital malformations of nervous system: Secondary | ICD-10-CM | POA: Diagnosis not present

## 2022-02-16 DIAGNOSIS — H40023 Open angle with borderline findings, high risk, bilateral: Secondary | ICD-10-CM | POA: Diagnosis not present

## 2022-03-18 DIAGNOSIS — H25813 Combined forms of age-related cataract, bilateral: Secondary | ICD-10-CM | POA: Diagnosis not present

## 2022-03-18 DIAGNOSIS — H401133 Primary open-angle glaucoma, bilateral, severe stage: Secondary | ICD-10-CM | POA: Diagnosis not present

## 2022-03-18 DIAGNOSIS — Q078 Other specified congenital malformations of nervous system: Secondary | ICD-10-CM | POA: Diagnosis not present

## 2022-04-02 DIAGNOSIS — H401123 Primary open-angle glaucoma, left eye, severe stage: Secondary | ICD-10-CM | POA: Diagnosis not present

## 2022-04-02 DIAGNOSIS — H5703 Miosis: Secondary | ICD-10-CM | POA: Diagnosis not present

## 2022-04-02 DIAGNOSIS — H2512 Age-related nuclear cataract, left eye: Secondary | ICD-10-CM | POA: Diagnosis not present

## 2023-05-14 DIAGNOSIS — I129 Hypertensive chronic kidney disease with stage 1 through stage 4 chronic kidney disease, or unspecified chronic kidney disease: Secondary | ICD-10-CM | POA: Diagnosis not present

## 2023-05-14 DIAGNOSIS — Z0000 Encounter for general adult medical examination without abnormal findings: Secondary | ICD-10-CM | POA: Diagnosis not present

## 2023-05-14 DIAGNOSIS — E1169 Type 2 diabetes mellitus with other specified complication: Secondary | ICD-10-CM | POA: Diagnosis not present

## 2023-05-14 DIAGNOSIS — N182 Chronic kidney disease, stage 2 (mild): Secondary | ICD-10-CM | POA: Diagnosis not present

## 2023-05-14 DIAGNOSIS — E7800 Pure hypercholesterolemia, unspecified: Secondary | ICD-10-CM | POA: Diagnosis not present

## 2023-05-19 ENCOUNTER — Other Ambulatory Visit: Payer: Self-pay

## 2023-05-19 ENCOUNTER — Encounter (HOSPITAL_COMMUNITY): Payer: Self-pay

## 2023-05-19 ENCOUNTER — Emergency Department (HOSPITAL_COMMUNITY)
Admission: EM | Admit: 2023-05-19 | Discharge: 2023-05-19 | Disposition: A | Discharge status: Home / Self Care | Payer: Medicare Other | Attending: Emergency Medicine | Admitting: Emergency Medicine

## 2023-05-19 DIAGNOSIS — R1111 Vomiting without nausea: Secondary | ICD-10-CM | POA: Diagnosis not present

## 2023-05-19 DIAGNOSIS — F0390 Unspecified dementia without behavioral disturbance: Secondary | ICD-10-CM | POA: Diagnosis not present

## 2023-05-19 DIAGNOSIS — R112 Nausea with vomiting, unspecified: Secondary | ICD-10-CM | POA: Diagnosis not present

## 2023-05-19 DIAGNOSIS — R110 Nausea: Secondary | ICD-10-CM | POA: Diagnosis not present

## 2023-05-19 DIAGNOSIS — R1110 Vomiting, unspecified: Secondary | ICD-10-CM | POA: Diagnosis not present

## 2023-05-19 DIAGNOSIS — I10 Essential (primary) hypertension: Secondary | ICD-10-CM | POA: Diagnosis not present

## 2023-05-19 LAB — CBC WITH DIFFERENTIAL/PLATELET
Abs Immature Granulocytes: 0.03 10*3/uL (ref 0.00–0.07)
Basophils Absolute: 0.1 10*3/uL (ref 0.0–0.1)
Basophils Relative: 1 %
Eosinophils Absolute: 0.1 10*3/uL (ref 0.0–0.5)
Eosinophils Relative: 1 %
HCT: 41.3 % (ref 36.0–46.0)
Hemoglobin: 13.3 g/dL (ref 12.0–15.0)
Immature Granulocytes: 0 %
Lymphocytes Relative: 20 %
Lymphs Abs: 1.6 10*3/uL (ref 0.7–4.0)
MCH: 26 pg (ref 26.0–34.0)
MCHC: 32.2 g/dL (ref 30.0–36.0)
MCV: 80.8 fL (ref 80.0–100.0)
Monocytes Absolute: 0.4 10*3/uL (ref 0.1–1.0)
Monocytes Relative: 5 %
Neutro Abs: 5.9 10*3/uL (ref 1.7–7.7)
Neutrophils Relative %: 73 %
Platelets: 283 10*3/uL (ref 150–400)
RBC: 5.11 MIL/uL (ref 3.87–5.11)
RDW: 15 % (ref 11.5–15.5)
WBC: 8 10*3/uL (ref 4.0–10.5)
nRBC: 0 % (ref 0.0–0.2)

## 2023-05-19 LAB — LIPASE, BLOOD: Lipase: 24 U/L (ref 11–51)

## 2023-05-19 LAB — COMPREHENSIVE METABOLIC PANEL
ALT: 10 U/L (ref 0–44)
AST: 16 U/L (ref 15–41)
Albumin: 3.6 g/dL (ref 3.5–5.0)
Alkaline Phosphatase: 52 U/L (ref 38–126)
Anion gap: 12 (ref 5–15)
BUN: 10 mg/dL (ref 8–23)
CO2: 23 mmol/L (ref 22–32)
Calcium: 8.8 mg/dL — ABNORMAL LOW (ref 8.9–10.3)
Chloride: 102 mmol/L (ref 98–111)
Creatinine, Ser: 0.82 mg/dL (ref 0.44–1.00)
GFR, Estimated: >60 mL/min (ref >60)
Glucose, Bld: 137 mg/dL — ABNORMAL HIGH (ref 70–99)
Potassium: 3.9 mmol/L (ref 3.5–5.1)
Sodium: 137 mmol/L (ref 135–145)
Total Bilirubin: 0.8 mg/dL (ref 0.3–1.2)
Total Protein: 7.2 g/dL (ref 6.5–8.1)

## 2023-05-19 NOTE — ED Provider Notes (Signed)
[place] EM[facility]ENC[facility] DEPARTMENT AT [facility] Provider Note   CSN: [id number] Arrival date & time: 05/19/23  1725     History  Chief Complaint  Patient presents with   Emesis    Meghan Adkins is a 89 y.o. female.  HPI   89 year old female with past medical history of dementia and chronic vision loss presents to the emergency department accompanied by family member for concern of  vomiting.  Patient is a poor historian secondary to dementia and loss of hearing however she is pleasant, oriented per baseline.  She has no acute complaints at this time.  Family member who is with her states that she has had a decreased appetite with episodes of non bloody vomiting today.  In regards to the vision loss this has been chronic, over a couple years.  There does not appear to be any acute vision change.  She is following with ophtho as an outpatient. No reported facial droop, speech change or unilateral weakness.  Patient denies any abdominal pain.  No diarrhea.   Home Medications Prior to Admission medications   Medication Sig Start Date End Date Taking? Authorizing Provider  acetaminophen (TYLENOL) 325 MG tablet Take 2 tablets (650 mg total) by mouth every 6 (six) hours as needed. 05/20/20   Darr, Gerilyn Pilgrim, PA-C  diclofenac Sodium (VOLTAREN) 1 % GEL Apply 2 g topically 4 (four) times daily. 05/20/20   Darr, Gerilyn Pilgrim, PA-C      Allergies    Patient has no known allergies.    Review of Systems   Review of Systems  Unable to perform ROS: Dementia    Physical Exam Updated Vital Signs BP (!) 154/69 (BP Location: Left Arm)   Pulse 62   Temp 98.1 F (36.7 C) (Oral)   Resp 19   Ht 5\' 3"  (1.6 m)   Wt 65.8 kg   SpO2 99%   BMI 25.70 kg/m  Physical Exam Vitals and nursing note reviewed.  Constitutional:      General: She is not in acute distress.    Appearance: Normal appearance.  HENT:     Head: Normocephalic.     Mouth/Throat:     Mouth: Mucous membranes are moist.  Eyes:      Extraocular Movements: Extraocular movements intact.     Comments: Patient does not fully localize on exam due to chronic/severe vision loss, previous cataract surgery in the left  Cardiovascular:     Rate and Rhythm: Normal rate.  Pulmonary:     Effort: Pulmonary effort is normal. No respiratory distress.  Abdominal:     Palpations: Abdomen is soft.     Tenderness: There is no abdominal tenderness. There is no guarding.  Skin:    General: Skin is warm.  Neurological:     General: No focal deficit present.     Mental Status: She is alert. Mental status is at baseline.     Cranial Nerves: No cranial nerve deficit.  Psychiatric:        Mood and Affect: Mood normal.     ED Results / Procedures / Treatments   Labs (all labs ordered are listed, but only abnormal results are displayed) Labs Reviewed  CBC WITH DIFFERENTIAL/PLATELET  COMPREHENSIVE METABOLIC PANEL  LIPASE, BLOOD  URINALYSIS, ROUTINE W REFLEX MICROSCOPIC    EKG None  Radiology No results found.  Procedures Procedures    Medications Ordered in ED Medications - No data to display  ED Course/ Medical Decision Making/ A&P  Medical Decision Making Amount and/or Complexity of Data Reviewed Labs: ordered.   89 year old female presents emergency department after episodes of vomiting today.  There is also mention of chronic vision loss but this has been going on over the course of years.  She is following as an outpatient with optometry.  Has not seem to be any acute vision loss or neurosymptoms today.  Vital signs are stable on arrival.  Patient has no complaint of abdominal pain.  There is been no diarrhea.  Possible dark urine but no other symptoms.  Blood work is reassuring.  No leukocytosis, abdominal labs are negative.  Patient urinated but no sample was collected.  Low suspicion for UTI at this time.  After dose of Zofran with EMS she has had no further nausea/vomiting.  She is  been able to tolerate p.o.  Discussed with the patient and family member following up as an outpatient for UA to be complete.  They understand and agree.  Otherwise she has no ongoing symptoms and is requesting to be discharged home.  Patient at this time appears safe and stable for discharge and close outpatient follow up. Discharge plan and strict return to ED precautions discussed, patient verbalizes understanding and agreement.        Final Clinical Impression(s) / ED Diagnoses Final diagnoses:  None    Rx / DC Orders ED Discharge Orders     None         Rozelle Logan, DO 05/19/23 2358

## 2023-05-19 NOTE — ED Triage Notes (Signed)
Patient had lunch today and began vomiting afterwards. Per EMS patient had 2 episodes of vomiting. Patient was given 4 mg zofran via IV and reports relief. Patient lives at home with grand daughter. Patient has also had lost of vision over the last couple of weeks per grand daughter report to EMS. During triage patient is able to correctly ID self year and city. Patient is unable to visualize surrounding to identified specific location. Patient has hx of dementia. Per patient report loss of vision is recent

## 2023-05-19 NOTE — Discharge Instructions (Addendum)
You have been seen and discharged from the emergency department.  Your blood work was normal for you.  It is recommended that you have a urinalysis done as an outpatient to rule out urinary tract infection.  Follow-up with your primary provider for further evaluation and further care. Take home medications as prescribed. If you have any worsening symptoms or further concerns for your health please return to an emergency department for further evaluation.

## 2023-05-31 DIAGNOSIS — Z961 Presence of intraocular lens: Secondary | ICD-10-CM | POA: Diagnosis not present

## 2023-05-31 DIAGNOSIS — H25811 Combined forms of age-related cataract, right eye: Secondary | ICD-10-CM | POA: Diagnosis not present

## 2023-05-31 DIAGNOSIS — H401133 Primary open-angle glaucoma, bilateral, severe stage: Secondary | ICD-10-CM | POA: Diagnosis not present

## 2023-07-27 DIAGNOSIS — H409 Unspecified glaucoma: Secondary | ICD-10-CM | POA: Diagnosis not present

## 2023-10-15 DIAGNOSIS — H25811 Combined forms of age-related cataract, right eye: Secondary | ICD-10-CM | POA: Diagnosis not present

## 2023-10-15 DIAGNOSIS — Z961 Presence of intraocular lens: Secondary | ICD-10-CM | POA: Diagnosis not present

## 2023-10-15 DIAGNOSIS — Q078 Other specified congenital malformations of nervous system: Secondary | ICD-10-CM | POA: Diagnosis not present

## 2023-10-15 DIAGNOSIS — H401133 Primary open-angle glaucoma, bilateral, severe stage: Secondary | ICD-10-CM | POA: Diagnosis not present

## 2024-02-22 DIAGNOSIS — H25811 Combined forms of age-related cataract, right eye: Secondary | ICD-10-CM | POA: Diagnosis not present

## 2024-02-22 DIAGNOSIS — H401133 Primary open-angle glaucoma, bilateral, severe stage: Secondary | ICD-10-CM | POA: Diagnosis not present

## 2024-02-22 DIAGNOSIS — Z961 Presence of intraocular lens: Secondary | ICD-10-CM | POA: Diagnosis not present

## 2024-07-26 DIAGNOSIS — H25811 Combined forms of age-related cataract, right eye: Secondary | ICD-10-CM | POA: Diagnosis not present

## 2024-07-26 DIAGNOSIS — Z961 Presence of intraocular lens: Secondary | ICD-10-CM | POA: Diagnosis not present

## 2024-07-26 DIAGNOSIS — Q078 Other specified congenital malformations of nervous system: Secondary | ICD-10-CM | POA: Diagnosis not present

## 2024-07-26 DIAGNOSIS — H401133 Primary open-angle glaucoma, bilateral, severe stage: Secondary | ICD-10-CM | POA: Diagnosis not present

## 2024-09-26 DIAGNOSIS — Z961 Presence of intraocular lens: Secondary | ICD-10-CM | POA: Diagnosis not present

## 2024-09-26 DIAGNOSIS — H25811 Combined forms of age-related cataract, right eye: Secondary | ICD-10-CM | POA: Diagnosis not present

## 2024-09-26 DIAGNOSIS — Q078 Other specified congenital malformations of nervous system: Secondary | ICD-10-CM | POA: Diagnosis not present

## 2024-09-26 DIAGNOSIS — H401133 Primary open-angle glaucoma, bilateral, severe stage: Secondary | ICD-10-CM | POA: Diagnosis not present

## 2024-10-11 ENCOUNTER — Ambulatory Visit: Payer: Self-pay | Admitting: Family

## 2024-10-25 ENCOUNTER — Ambulatory Visit: Payer: Self-pay | Admitting: Family

## 2024-10-31 ENCOUNTER — Encounter: Admitting: Family

## 2024-10-31 NOTE — Progress Notes (Signed)
   This encounter was created in error - please disregard. No show
